# Patient Record
Sex: Female | Born: 2000 | Hispanic: No | Marital: Single | Smoking: Never smoker
Health system: Southern US, Community
[De-identification: ages and names within clinical notes are randomized; demographics above are authoritative.]

## PROBLEM LIST (undated history)

## (undated) DIAGNOSIS — E282 Polycystic ovarian syndrome: Secondary | ICD-10-CM

## (undated) DIAGNOSIS — E119 Type 2 diabetes mellitus without complications: Secondary | ICD-10-CM

## (undated) DIAGNOSIS — Z789 Other specified health status: Secondary | ICD-10-CM

## (undated) HISTORY — DX: Type 2 diabetes mellitus without complications: E11.9

## (undated) HISTORY — PX: OTHER SURGICAL HISTORY: SHX169

## (undated) HISTORY — DX: Other specified health status: Z78.9

## (undated) HISTORY — DX: Polycystic ovarian syndrome: E28.2

---
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
## 2020-12-18 ENCOUNTER — Ambulatory Visit (INDEPENDENT_AMBULATORY_CARE_PROVIDER_SITE_OTHER): Payer: Medicaid Other | Admitting: Certified Nurse Midwife

## 2020-12-18 ENCOUNTER — Encounter: Payer: Self-pay | Admitting: Certified Nurse Midwife

## 2020-12-18 ENCOUNTER — Other Ambulatory Visit: Payer: Self-pay

## 2020-12-18 VITALS — BP 88/52 | HR 94 | Ht 63.0 in | Wt 184.8 lb

## 2020-12-18 DIAGNOSIS — N921 Excessive and frequent menstruation with irregular cycle: Secondary | ICD-10-CM | POA: Insufficient documentation

## 2020-12-18 DIAGNOSIS — N939 Abnormal uterine and vaginal bleeding, unspecified: Secondary | ICD-10-CM

## 2020-12-18 LAB — POCT URINE PREGNANCY: Preg Test, Ur: NEGATIVE

## 2020-12-18 NOTE — Progress Notes (Signed)
GYN ENCOUNTER NOTE  Subjective:       Janice Haynes is a 20 y.o. G0P0000 female is here for gynecologic evaluation of the following issues:  1. Long irregular periods, desires to have regular cycles   Denies breathing difficulty, respiratory distress, chest pain, abdominal pain, and leg swelling or pain.  Gynecologic History  Patient's last menstrual period was 11/18/2020.  Period Duration (Days): 21 Period Pattern: (!) Irregular Menstrual Flow: Moderate Menstrual Control: Maxi pad,Panty liner Menstrual Control Change Freq (Hours): 4-5 Dysmenorrhea: (!) Moderate Dysmenorrhea Symptoms: Cramping,Diarrhea,Headache  Contraception: none  Last Pap: N/A  Obstetric History  OB History  Gravida Para Term Preterm AB Living  0 0 0 0 0 0  SAB IAB Ectopic Multiple Live Births  0 0 0 0 0    Past Medical History:  Diagnosis Date  . No pertinent past medical history     Past Surgical History:  Procedure Laterality Date  . no surgical history      No current outpatient medications on file prior to visit.   No current facility-administered medications on file prior to visit.    No Known Allergies  Social History   Socioeconomic History  . Marital status: Single    Spouse name: Not on file  . Number of children: Not on file  . Years of education: Not on file  . Highest education level: Not on file  Occupational History  . Not on file  Tobacco Use  . Smoking status: Never Smoker  . Smokeless tobacco: Never Used  Vaping Use  . Vaping Use: Never used  Substance and Sexual Activity  . Alcohol use: Never  . Drug use: Never  . Sexual activity: Yes    Birth control/protection: None  Other Topics Concern  . Not on file  Social History Narrative  . Not on file   Social Determinants of Health   Financial Resource Strain: Not on file  Food Insecurity: Not on file  Transportation Needs: Not on file  Physical Activity: Not on file  Stress: Not on file  Social  Connections: Not on file  Intimate Partner Violence: Not on file    Family History  Problem Relation Age of Onset  . Hypertension Mother   . Asthma Mother   . Asthma Father     The following portions of the patient's history were reviewed and updated as appropriate: allergies, current medications, past family history, past medical history, past social history, past surgical history and problem list.  Review of Systems  ROS- Negative other than what was reported. Information obtained verbally from patient.   Objective:   BP (!) 88/52   Pulse 94   Ht 5\' 3"  (1.6 m)   Wt 83.8 kg   LMP 11/18/2020   BMI 32.74 kg/m    CONSTITUTIONAL: Well-developed, well-nourished female in no acute distress.   PHYSICAL EXAM: Declined by patient.   Assessment:   1. Menorrhagia with irregular cycle  - POCT urine pregnancy  2. Abnormal Uterine Bleeding   Plan:   Discussed possible reasons for irregular menses and work up associated with each.   Reviewed all forms of birth control options available including abstinence; fertility period awareness methods; over the counter/barrier methods; hormonal contraceptive medication including pill, patch, ring, injection,contraceptive implant; hormonal and nonhormonal IUDs; permanent sterilization options including vasectomy and the various tubal sterilization modalities. Risks and benefits reviewed.  Questions were answered.  Information was given to patient to review.   Desires Nexplanon placement, wishes to  return during menses   Reviewed red flag symptoms and when to call.  RTC for labs and vaginal ultrasound, see orders.  RTC for Nexplanon placement when desired.    Juliann Pares, Student-MidWife [employer] 12/18/20 12:14 PM   I spent 20 minutes dedicated to the care of this patient on the date of this encounter to include pre-visit review of records, face to face time with patient, and post visit ordering of testing.

## 2020-12-18 NOTE — Progress Notes (Signed)
Pt present for irregular cycles. Pt stated that she spots off and on throughout the month and stated having one cycle for a month bleeding off and on.

## 2020-12-18 NOTE — Patient Instructions (Signed)
Etonogestrel implant What is this medicine? ETONOGESTREL (et oh noe JES trel) is a contraceptive (birth control) device. It is used to prevent pregnancy. It can be used for up to 3 years. This medicine may be used for other purposes; ask your health care provider or pharmacist if you have questions. COMMON BRAND NAME(S): Implanon, Nexplanon What should I tell my health care provider before I take this medicine? They need to know if you have any of these conditions:  abnormal vaginal bleeding  blood vessel disease or blood clots  breast, cervical, endometrial, ovarian, liver, or uterine cancer  diabetes  gallbladder disease  heart disease or recent heart attack  high blood pressure  high cholesterol or triglycerides  kidney disease  liver disease  migraine headaches  seizures  stroke  tobacco smoker  an unusual or allergic reaction to etonogestrel, anesthetics or antiseptics, other medicines, foods, dyes, or preservatives  pregnant or trying to get pregnant  breast-feeding How should I use this medicine? This device is inserted just under the skin on the inner side of your upper arm by a health care professional. Talk to your pediatrician regarding the use of this medicine in children. Special care may be needed. Overdosage: If you think you have taken too much of this medicine contact a poison control center or emergency room at once. NOTE: This medicine is only for you. Do not share this medicine with others. What if I miss a dose? This does not apply. What may interact with this medicine? Do not take this medicine with any of the following medications:  amprenavir  fosamprenavir This medicine may also interact with the following medications:  acitretin  aprepitant  armodafinil  bexarotene  bosentan  carbamazepine  certain medicines for fungal infections like fluconazole, ketoconazole, itraconazole and voriconazole  certain medicines to treat  hepatitis, HIV or AIDS  cyclosporine  felbamate  griseofulvin  lamotrigine  modafinil  oxcarbazepine  phenobarbital  phenytoin  primidone  rifabutin  rifampin  rifapentine  St. John's wort  topiramate This list may not describe all possible interactions. Give your health care provider a list of all the medicines, herbs, non-prescription drugs, or dietary supplements you use. Also tell them if you smoke, drink alcohol, or use illegal drugs. Some items may interact with your medicine. What should I watch for while using this medicine? This product does not protect you against HIV infection (AIDS) or other sexually transmitted diseases. You should be able to feel the implant by pressing your fingertips over the skin where it was inserted. Contact your doctor if you cannot feel the implant, and use a non-hormonal birth control method (such as condoms) until your doctor confirms that the implant is in place. Contact your doctor if you think that the implant may have broken or become bent while in your arm. You will receive a user card from your health care provider after the implant is inserted. The card is a record of the location of the implant in your upper arm and when it should be removed. Keep this card with your health records. What side effects may I notice from receiving this medicine? Side effects that you should report to your doctor or health care professional as soon as possible:  allergic reactions like skin rash, itching or hives, swelling of the face, lips, or tongue  breast lumps, breast tissue changes, or discharge  breathing problems  changes in emotions or moods  coughing up blood  if you feel that the implant   may have broken or bent while in your arm  high blood pressure  pain, irritation, swelling, or bruising at the insertion site  scar at site of insertion  signs of infection at the insertion site such as fever, and skin redness, pain or  discharge  signs and symptoms of a blood clot such as breathing problems; changes in vision; chest pain; severe, sudden headache; pain, swelling, warmth in the leg; trouble speaking; sudden numbness or weakness of the face, arm or leg  signs and symptoms of liver injury like dark yellow or brown urine; general ill feeling or flu-like symptoms; light-colored stools; loss of appetite; nausea; right upper belly pain; unusually weak or tired; yellowing of the eyes or skin  unusual vaginal bleeding, discharge Side effects that usually do not require medical attention (report to your doctor or health care professional if they continue or are bothersome):  acne  breast pain or tenderness  headache  irregular menstrual bleeding  nausea This list may not describe all possible side effects. Call your doctor for medical advice about side effects. You may report side effects to FDA at 1-800-FDA-1088. Where should I keep my medicine? This drug is given in a hospital or clinic and will not be stored at home. NOTE: This sheet is a summary. It may not cover all possible information. If you have questions about this medicine, talk to your doctor, pharmacist, or health care provider.  2021 Elsevier/Gold Standard (2019-08-17 11:33:04)   Abnormal Uterine Bleeding Abnormal uterine bleeding means bleeding more than usual from your womb (uterus). It can include:  Bleeding between menstrual periods.  Bleeding after sex.  Bleeding that is heavier than normal.  Menstrual periods that last longer than usual.  Bleeding after you have stopped having your menstrual period (menopause). There are many problems that may cause this. You should see a doctor for any kind of bleeding that is not normal. Treatment depends on the cause of the bleeding. Follow these instructions at home: Medicines  Take over-the-counter and prescription medicines only as told by your doctor.  Tell your doctor about other  medicines that you take. ? If told by your doctor, stop taking aspirin or medicines that have aspirin in them. These medicines can make you bleed more.  You may be given iron pills to replace iron that your body loses because of this condition. Take them as told by your doctor. Managing constipation If you are taking iron pills, you may have trouble pooping (constipation). To prevent or treat trouble pooping, you may need to:  Drink enough fluid to keep your pee (urine) pale yellow.  Take over-the-counter or prescription medicines.  Eat foods that are high in fiber. These include beans, whole grains, and fresh fruits and vegetables.  Limit foods that are high in fat and sugar. These include fried or sweet foods. General instructions  Watch your condition for any changes.  Do not use tampons, douche, or have sex, if your doctor tells you not to.  Change your pads often.  Get regular exams. This includes pelvic exams and cervical cancer screenings. ? It is up to you to get the results of any tests that are done. Ask your doctor, or the department that is doing the tests, when your results will be ready.  Keep all follow-up visits as told by your doctor. This is important. Contact a doctor if:  The bleeding lasts more than 1 week.  You feel dizzy at times.  You feel like you may  vomit (nausea).  You vomit.  You feel light-headed or weak.  Your symptoms get worse. Get help right away if:  You pass out.  You have to change pads every hour.  You have pain in your belly.  You have a fever or chills.  You get sweaty.  You get weak.  You pass large blood clots from your vagina. Summary  Abnormal uterine bleeding means bleeding more than usual from your womb (uterus).  Any kind of bleeding that is not normal should be checked by a doctor.  Treatment depends on the cause of the bleeding.  Get help right away if you pass out, you have to change pads every hour, or  you pass large blood clots from your vagina. This information is not intended to replace advice given to you by your health care provider. Make sure you discuss any questions you have with your health care provider. Document Revised: 09/07/2019 Document Reviewed: 09/07/2019 Elsevier Patient Education  2021 [employer].

## 2020-12-18 NOTE — Progress Notes (Signed)
I have seen, interviewed, and examined the patient in conjunction with the [employer] and affirm the diagnosis and management plan.   Gunnar Bulla, CNM [facility], [facility] 12/18/20 2:06 PM

## 2021-01-01 ENCOUNTER — Telehealth: Payer: Self-pay

## 2021-01-01 NOTE — Telephone Encounter (Signed)
Patient is NOT OB patient. May have labs collected at any time. Thanks, JML

## 2021-01-01 NOTE — Telephone Encounter (Addendum)
Called patient to reschedule lab apt on 2-17, LMTCB. Patient is scheduled on 2-17 for [place] and GYN visit- is it appropriate to have pt come in for labs prior to ob visit or can patient have labs done at another [facility] location after appointment?

## 2021-01-04 ENCOUNTER — Other Ambulatory Visit: Payer: Medicaid Other

## 2021-01-04 ENCOUNTER — Encounter: Payer: Medicaid Other | Admitting: Certified Nurse Midwife

## 2021-02-12 ENCOUNTER — Other Ambulatory Visit: Payer: Self-pay | Admitting: Certified Nurse Midwife

## 2021-02-12 DIAGNOSIS — N921 Excessive and frequent menstruation with irregular cycle: Secondary | ICD-10-CM

## 2021-02-12 DIAGNOSIS — N939 Abnormal uterine and vaginal bleeding, unspecified: Secondary | ICD-10-CM

## 2021-02-21 ENCOUNTER — Ambulatory Visit: Admission: RE | Admit: 2021-02-21 | Payer: Medicaid Other | Source: Ambulatory Visit

## 2021-05-22 ENCOUNTER — Ambulatory Visit: Payer: Medicaid Other | Admitting: Cardiology

## 2021-05-23 ENCOUNTER — Encounter: Payer: Self-pay | Admitting: Cardiology

## 2021-11-05 ENCOUNTER — Encounter: Payer: Self-pay | Admitting: Emergency Medicine

## 2021-11-05 DIAGNOSIS — N939 Abnormal uterine and vaginal bleeding, unspecified: Secondary | ICD-10-CM | POA: Insufficient documentation

## 2021-11-05 DIAGNOSIS — R102 Pelvic and perineal pain: Secondary | ICD-10-CM | POA: Diagnosis present

## 2021-11-05 DIAGNOSIS — D649 Anemia, unspecified: Secondary | ICD-10-CM | POA: Insufficient documentation

## 2021-11-05 LAB — CBC
HCT: 29.4 % — ABNORMAL LOW (ref 36.0–46.0)
Hemoglobin: 8.5 g/dL — ABNORMAL LOW (ref 12.0–15.0)
MCH: 19.4 pg — ABNORMAL LOW (ref 26.0–34.0)
MCHC: 28.9 g/dL — ABNORMAL LOW (ref 30.0–36.0)
MCV: 67.1 fL — ABNORMAL LOW (ref 80.0–100.0)
Platelets: 401 10*3/uL — ABNORMAL HIGH (ref 150–400)
RBC: 4.38 MIL/uL (ref 3.87–5.11)
RDW: 17.9 % — ABNORMAL HIGH (ref 11.5–15.5)
WBC: 11.9 10*3/uL — ABNORMAL HIGH (ref 4.0–10.5)
nRBC: 0 % (ref 0.0–0.2)

## 2021-11-05 LAB — HCG, QUANTITATIVE, PREGNANCY: hCG, Beta Chain, Quant, S: <1 m[IU]/mL (ref <5)

## 2021-11-05 NOTE — ED Triage Notes (Signed)
Pt reports she has had continued vaginal bleeding since July and seen OBGYN, started on new birth control but still continuing to bleed. Pt to ED tonight for concern of need for blood because pt anemic. Pt sts she has had intermittent symptoms since July with lightheadedness as well as pale skin tone, increased malaise. Last had blood work 1 month ago for symptoms, no blood transfusion needed at that time per pt.

## 2021-11-06 ENCOUNTER — Emergency Department: Payer: Medicaid Other

## 2021-11-06 ENCOUNTER — Emergency Department
Admission: EM | Admit: 2021-11-06 | Discharge: 2021-11-06 | Disposition: A | Discharge status: Home / Self Care | Payer: Medicaid Other | Attending: Emergency Medicine | Admitting: Emergency Medicine

## 2021-11-06 DIAGNOSIS — D649 Anemia, unspecified: Secondary | ICD-10-CM

## 2021-11-06 DIAGNOSIS — N939 Abnormal uterine and vaginal bleeding, unspecified: Secondary | ICD-10-CM

## 2021-11-06 DIAGNOSIS — R52 Pain, unspecified: Secondary | ICD-10-CM

## 2021-11-06 LAB — COMPREHENSIVE METABOLIC PANEL
ALT: 13 U/L (ref 0–44)
AST: 18 U/L (ref 15–41)
Albumin: 3.6 g/dL (ref 3.5–5.0)
Alkaline Phosphatase: 55 U/L (ref 38–126)
Anion gap: 7 (ref 5–15)
BUN: 13 mg/dL (ref 6–20)
CO2: 24 mmol/L (ref 22–32)
Calcium: 8.6 mg/dL — ABNORMAL LOW (ref 8.9–10.3)
Chloride: 105 mmol/L (ref 98–111)
Creatinine, Ser: 0.66 mg/dL (ref 0.44–1.00)
GFR, Estimated: >60 mL/min (ref >60)
Glucose, Bld: 104 mg/dL — ABNORMAL HIGH (ref 70–99)
Potassium: 3.9 mmol/L (ref 3.5–5.1)
Sodium: 136 mmol/L (ref 135–145)
Total Bilirubin: 0.2 mg/dL — ABNORMAL LOW (ref 0.3–1.2)
Total Protein: 7.9 g/dL (ref 6.5–8.1)

## 2021-11-06 MED ORDER — MEDROXYPROGESTERONE ACETATE 10 MG PO TABS: 10.0000 mg | ORAL_TABLET | Freq: Every day | ORAL | 0 refills | Status: DC

## 2021-11-06 NOTE — Discharge Instructions (Addendum)
1.  I strongly encourage you to take iron tablets daily. 2.  Take Provera 10mg  daily x10 days. 3.  Return to the ER for worsening symptoms, persistent vomiting, difficulty breathing or other concerns.

## 2021-11-06 NOTE — ED Provider Notes (Signed)
[facility] Emergency Department Provider Note   ____________________________________________   Event Date/Time   First MD Initiated Contact with Patient 11/06/21 0532     (approximate)  I have reviewed the triage vital signs and the nursing notes.   HISTORY  Chief Complaint Vaginal Bleeding    HPI Janice Haynes is a 20 y.o. female who presents to the ED from home with a chief complaint of vaginal bleeding.  Patient has had continued vaginal bleeding since July.  Also recently diagnosed with PCOS.  She is followed by Dr. Leafy Ro from GYN who started her on contraceptive patch 5 weeks ago.  She has been instructed to take iron tablets but admits she does not because she does not like to take medicines.  Patient concern for heavy vaginal bleeding and concern for anemia requiring blood transfusion.  Experiences occasional lightheadedness.  Denies fever, cough, chest pain, shortness of breath, nausea, vomiting.     Past Medical History:  Diagnosis Date   No pertinent past medical history     Patient Active Problem List   Diagnosis Date Noted   Menorrhagia with irregular cycle 12/18/2020    Past Surgical History:  Procedure Laterality Date   no surgical history      Prior to Admission medications   Medication Sig Start Date End Date Taking? Authorizing Provider  medroxyPROGESTERone (PROVERA) 10 MG tablet Take 1 tablet (10 mg total) by mouth daily. 11/06/21  Yes Paulette Blanch, MD    Allergies Patient has no known allergies.  Family History  Problem Relation Age of Onset   Hypertension Mother    Asthma Mother    Asthma Father     Social History Social History   Tobacco Use   Smoking status: Never   Smokeless tobacco: Never  Vaping Use   Vaping Use: Never used  Substance Use Topics   Alcohol use: Never   Drug use: Never    Review of Systems  Constitutional: No fever/chills Eyes: No visual changes. ENT: No sore  throat. Cardiovascular: Denies chest pain. Respiratory: Denies shortness of breath. Gastrointestinal: No abdominal pain.  No nausea, no vomiting.  No diarrhea.  No constipation. Genitourinary: Positive for heavy vaginal bleeding. Negative for dysuria. Musculoskeletal: Negative for back pain. Skin: Negative for rash. Neurological: Negative for headaches, focal weakness or numbness.   ____________________________________________   PHYSICAL EXAM:  VITAL SIGNS: ED Triage Vitals [11/05/21 2251]  Enc Vitals Group     BP (!) 169/94     Pulse Rate (!) 112     Resp 20     Temp 98.7 F (37.1 C)     Temp Source Oral     SpO2 100 %     Weight 186 lb (84.4 kg)     Height 5\' 3"  (1.6 m)     Head Circumference      Peak Flow      Pain Score      Pain Loc      Pain Edu?      Excl. in [place]?     Constitutional: Alert and oriented. Well appearing and in no acute distress. Eyes: Conjunctivae are normal. PERRL. EOMI. Head: Atraumatic. Nose: No congestion/rhinnorhea. Mouth/Throat: Mucous membranes are moist.   Neck: No stridor.   Cardiovascular: Normal rate, regular rhythm. Grossly normal heart sounds.  Good peripheral circulation. Respiratory: Normal respiratory effort.  No retractions. Lungs CTAB. Gastrointestinal: Soft and nontender to light or deep palpation. No distention. No abdominal bruits. No CVA  tenderness. Musculoskeletal: No lower extremity tenderness nor edema.  No joint effusions. Neurologic:  Normal speech and language. No gross focal neurologic deficits are appreciated. No gait instability. Skin:  Skin is warm, dry and intact. No rash noted. Psychiatric: Mood and affect are normal. Speech and behavior are normal.  ____________________________________________   LABS (all labs ordered are listed, but only abnormal results are displayed)  Labs Reviewed  COMPREHENSIVE METABOLIC PANEL - Abnormal; Notable for the following components:      Result Value   Glucose, Bld 104 (*)     Calcium 8.6 (*)    Total Bilirubin 0.2 (*)    All other components within normal limits  CBC - Abnormal; Notable for the following components:   WBC 11.9 (*)    Hemoglobin 8.5 (*)    HCT 29.4 (*)    MCV 67.1 (*)    MCH 19.4 (*)    MCHC 28.9 (*)    RDW 17.9 (*)    Platelets 401 (*)    All other components within normal limits  HCG, QUANTITATIVE, PREGNANCY   ____________________________________________  EKG  None ____________________________________________  RADIOLOGY I, Shuntel Fishburn J, personally viewed and evaluated these images (plain radiographs) as part of my medical decision making, as well as reviewing the written report by the radiologist.  ED MD interpretation:  Unremarkable uterus and endometrium; markers for PCOS  Official radiology report(s): US PELVIC COMPLETE W TRANSVAGINAL AND TORSION R/O  Result Date: 11/06/2021 CLINICAL DATA:  Pelvic pain. EXAM: TRANSABDOMINAL AND TRANSVAGINAL ULTRASOUND OF PELVIS DOPPLER ULTRASOUND OF OVARIES TECHNIQUE: Both transabdominal and transvaginal ultrasound examinations of the pelvis were performed. Transabdominal technique was performed for global imaging of the pelvis including uterus, ovaries, adnexal regions, and pelvic cul-de-sac. It was necessary to proceed with endovaginal exam following the transabdominal exam to visualize the endometrium and ovaries. Color and duplex Doppler ultrasound was utilized to evaluate blood flow to the ovaries. COMPARISON:  None. FINDINGS: Uterus Measurements: 6.0 x 3.4 x 4.0 cm = volume: 43 mL. No fibroids or other mass visualized. Endometrium Thickness: 2 mm.  No focal abnormality visualized. Right ovary Measurements: 5.0 x 1.8 x 2.1 cm = volume: 10.3 mL. Mildly enlarged with multiple peripherally located uniform sized follicles. Left ovary Measurements: 3.6 x 2.8 x 2.5 cm = volume: 13.1 mL. Mildly enlarged with multiple peripherally located uniform sized follicles. Pulsed Doppler evaluation of both  ovaries demonstrates normal low-resistance arterial and venous waveforms. Other findings No abnormal free fluid. IMPRESSION: 1. Unremarkable uterus and endometrium. 2. Polycystic ovarian morphology. Correlation with clinical exam and biochemical markers recommended to evaluate for possibility of polycystic ovarian syndrome. Electronically Signed   By: Elgie Collard M.D.   On: 11/06/2021 01:45    ____________________________________________   PROCEDURES  Procedure(s) performed (including Critical Care):  Procedures  Pelvic exam: External exam within normal limits without rashes, lesions or vesicles.  Speculum exam reveals mild vaginal bleeding.  Cervical os is closed.  Unremarkable bimanual exam  ____________________________________________   INITIAL IMPRESSION / ASSESSMENT AND PLAN / ED COURSE  As part of my medical decision making, I reviewed the following data within the electronic MEDICAL RECORD NUMBER Nursing notes reviewed and incorporated, Labs reviewed, Old chart reviewed (10/04/2021 GYN office visit), and Notes from prior ED visits     20 year old female here with continued vaginal bleeding since July. Differential diagnosis includes, but is not limited to, ovarian cyst, ovarian torsion, acute appendicitis, diverticulitis, urinary tract infection/pyelonephritis, endometriosis, bowel obstruction, colitis, renal colic, gastroenteritis,  hernia, fibroids, endometriosis, pregnancy related pain including ectopic pregnancy, etc.   Laboratory results demonstrate mild anemia not requiring blood transfusion.  Patient shows me her prior lab results from November which were H/H 9.4/33.  Updated her on laboratory and ultrasound results.  We discussed placing her on a 10-day course of Provera.  I did notify her that this does increase her risk for DVT given that she is on the contraceptive patch as well as a temporary course of Provera.  She is a non-smoker.  Patient does want to try Provera.  I  strongly encouraged her to resume iron tablets.  She will follow-up closely with GYN.  Strict return precautions given.  Patient verbalizes understanding agrees with plan of care.      ____________________________________________   FINAL CLINICAL IMPRESSION(S) / ED DIAGNOSES  Final diagnoses:  Pain  Abnormal uterine bleeding (AUB)  Anemia, unspecified type     ED Discharge Orders          Ordered    medroxyPROGESTERone (PROVERA) 10 MG tablet  Daily        11/06/21 0604             Note:  This document was prepared using Dragon voice recognition software and may include unintentional dictation errors.    Paulette Blanch, MD 11/06/21 [phone]

## 2021-11-20 DIAGNOSIS — E119 Type 2 diabetes mellitus without complications: Secondary | ICD-10-CM | POA: Insufficient documentation

## 2022-01-09 ENCOUNTER — Encounter: Payer: Self-pay | Admitting: Dietician

## 2022-01-09 ENCOUNTER — Other Ambulatory Visit: Payer: Self-pay

## 2022-01-09 ENCOUNTER — Encounter: Payer: Medicaid Other | Admitting: Dietician

## 2022-01-09 VITALS — Ht 62.0 in | Wt 185.4 lb

## 2022-01-09 DIAGNOSIS — R7309 Other abnormal glucose: Secondary | ICD-10-CM | POA: Diagnosis present

## 2022-01-09 DIAGNOSIS — Z713 Dietary counseling and surveillance: Secondary | ICD-10-CM | POA: Diagnosis not present

## 2022-01-09 NOTE — Patient Instructions (Signed)
Plan to have a meal or snack, or nutritious drink every 3-5 hours during the day. Keep working to drink plenty of water and other sugar free beverages during the day.  Work on ways to get better sleep. Inadequate sleep keeps blood sugar up and makes weight loss more difficult. Start back with some exercise, at least 15-20 minutes, as you are able to.

## 2022-01-09 NOTE — Progress Notes (Signed)
Medical Nutrition Therapy: Visit start time: 1100  end time: 1210  Assessment:  Diagnosis: abnormal blood glucose Past medical history: PCOS, heavy menstrual bleeding, iron deficiency Psychosocial issues/ stress concerns: none  Preferred learning method:  Visual Hands-on   Current weight: 185.4lbs Height: 5'2" BMI: 33.91  Medications, supplements: reconciled list in medical record  Progress and evaluation:  Recent HbA1C of 6.5% (09/20/21), patient will see MD next month and have repeat testing. She does report some frequent urination, thirst (but also unable to drink much during the day due to busy work schedule) She has been working on eating smaller portions and eating more slowly. She reports weight gain over past 4 years since eating meals with boyfriend's family, whose grandmother cooks fried foods and high fat foods often. Reports some recent decrease in appetite.  Works long hours 5-6 days a week; commutes 1 hour to work and back with sister. Patient reports some rapid heart rate, and running out of breath easily. Some family members have asthma and she wonders if she also has asthma. Patient reports lactose intolerance.  Physical activity: no regular exercise due to work schedule, fatigue  Dietary Intake:  Usual eating pattern includes 1-2 meals and 0-1 snacks per day. Dining out frequency: 0-1 meals per week.  Breakfast: usually skips, no time. Does have some cereal bars Snack: 0 Lunch: often skips due to work. Occasionally chips or other snack. Rice available at work, with seafood;  Snack: 0 Supper: mom cooks meals usually saturdays, patient cooks some meals; soups with veg ie butternut squash, rice; chicken with yellow rice; ground beef with gravy, onions, rice; baked spaghetti; alredo pasta; steak; chicken pie Snack: 0 Beverages: water, occasional soda ie Mt. Dew (causes heartburn); some koolaid or capri sun  Nutrition Care Education: Topics covered:  Basic nutrition:  basic food groups, appropriate nutrient balance, appropriate meal and snack schedule, general nutrition guidelines    Weight control: importance of low sugar and low fat choices, appropriate food portions, estimated energy needs for weight loss at 1300-1400kcal, provided guidance for 45% CHO, 25% pro, 30% fat Advanced nutrition:  cooking techniques to accommodate busy schedule ie batch cooking, meal prep Diabetes: appropriate meal and snack schedule, appropriate carb intake and balance, healthy carb choices, role of fiber, protein, fat   Nutritional Diagnosis:  [place]-2.2 Altered nutrition-related laboratory As related to pre-diabetes.  As evidenced by elevated HbA1C. [place]-3.3 Overweight/obesity As related to PCOS, history of excess calories, frequent skipped meals.  As evidenced by patient with current BMI of 33.9, making dietary and lifestyle changes to promote weight loss and improve health.  Intervention:  Instruction and discussion as noted above. Commended patient for changes made thus far.  Established additional goals for change with direction from patient.  Education Materials given:  [employer] guidelines for Diabetes Plate Planner with food lists, sample meal pattern Sample menus Carb-mindful smoothie recipe template Snacking handout Visit summary with goals/ instructions   Learner/ who was taught:  Patient    Level of understanding: Verbalizes/ demonstrates competency   Demonstrated degree of understanding via:   Teach back Learning barriers: None  Willingness to learn/ readiness for change: Eager, change in progress   Monitoring and Evaluation:  Dietary intake, exercise, BG control, nutritional status, and body weight      follow up:  02/20/22 at 11:00am

## 2022-02-27 ENCOUNTER — Ambulatory Visit: Payer: Medicaid Other | Admitting: Dietician

## 2022-03-14 ENCOUNTER — Encounter: Payer: Self-pay | Admitting: Dietician

## 2022-03-14 NOTE — Progress Notes (Signed)
Patient did not keep her follow up MNT appointment on 02/27/22. We have not heard from her to reschedule; sent notification to referring provider. ?

## 2022-06-24 ENCOUNTER — Other Ambulatory Visit: Payer: Self-pay

## 2022-06-24 ENCOUNTER — Emergency Department
Admission: EM | Admit: 2022-06-24 | Discharge: 2022-06-24 | Disposition: A | Discharge status: Home / Self Care | Payer: Medicaid Other | Attending: Emergency Medicine | Admitting: Emergency Medicine

## 2022-06-24 ENCOUNTER — Emergency Department: Payer: Medicaid Other

## 2022-06-24 ENCOUNTER — Encounter: Payer: Self-pay | Admitting: *Deleted

## 2022-06-24 DIAGNOSIS — Y92410 Unspecified street and highway as the place of occurrence of the external cause: Secondary | ICD-10-CM | POA: Insufficient documentation

## 2022-06-24 DIAGNOSIS — M545 Low back pain, unspecified: Secondary | ICD-10-CM | POA: Diagnosis present

## 2022-06-24 LAB — URINALYSIS, ROUTINE W REFLEX MICROSCOPIC
Bilirubin Urine: NEGATIVE
Glucose, UA: NEGATIVE mg/dL
Hgb urine dipstick: NEGATIVE
Ketones, ur: NEGATIVE mg/dL
Leukocytes,Ua: NEGATIVE
Nitrite: NEGATIVE
Protein, ur: NEGATIVE mg/dL
Specific Gravity, Urine: 1.027 (ref 1.005–1.030)
pH: 6 (ref 5.0–8.0)

## 2022-06-24 LAB — POC URINE PREG, ED: Preg Test, Ur: NEGATIVE

## 2022-06-24 MED ORDER — LIDOCAINE 5 % EX PTCH: 1.0000 | MEDICATED_PATCH | Freq: Two times a day (BID) | CUTANEOUS | 0 refills | Status: AC

## 2022-06-24 MED ORDER — ACETAMINOPHEN 325 MG PO TABS
650.0000 mg | ORAL_TABLET | Freq: Once | ORAL | Status: AC
Start: 2022-06-24 — End: 2022-06-24
  Administered 2022-06-24: 650 mg via ORAL
  Filled 2022-06-24: qty 2

## 2022-06-24 MED ORDER — LIDOCAINE 5 % EX PTCH
1.0000 | MEDICATED_PATCH | CUTANEOUS | Status: DC
  Administered 2022-06-24: 1 via TRANSDERMAL
  Filled 2022-06-24: qty 1

## 2022-06-24 MED ORDER — IBUPROFEN 600 MG PO TABS
600.0000 mg | ORAL_TABLET | Freq: Once | ORAL | Status: AC
Start: 2022-06-24 — End: 2022-06-24
  Administered 2022-06-24: 600 mg via ORAL
  Filled 2022-06-24: qty 1

## 2022-06-24 NOTE — Discharge Instructions (Addendum)
Your x-ray was normal.  You may use the patches for pain.  May also take Tylenol/ibuprofen per package instructions to help with any achiness.  Please return to the emergency department for any new, worsening, or changing symptoms or other concerns including weakness in your legs, urinary or stool incontinence or retention, numbness or tingling in your extremities/buttocks/groin, vision changes, vomiting, blood in your urine, chest pain, trouble breathing, or any other concerns or change in symptoms.
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
## 2023-02-25 DIAGNOSIS — N939 Abnormal uterine and vaginal bleeding, unspecified: Secondary | ICD-10-CM | POA: Insufficient documentation

## 2023-02-25 LAB — COMPREHENSIVE METABOLIC PANEL
ALT: 26 U/L (ref 0–44)
AST: 23 U/L (ref 15–41)
Albumin: 3.9 g/dL (ref 3.5–5.0)
Alkaline Phosphatase: 60 U/L (ref 38–126)
Anion gap: 9 (ref 5–15)
BUN: 11 mg/dL (ref 6–20)
CO2: 24 mmol/L (ref 22–32)
Calcium: 8.8 mg/dL — ABNORMAL LOW (ref 8.9–10.3)
Chloride: 102 mmol/L (ref 98–111)
Creatinine, Ser: 0.73 mg/dL (ref 0.44–1.00)
GFR, Estimated: >60 mL/min (ref >60)
Glucose, Bld: 185 mg/dL — ABNORMAL HIGH (ref 70–99)
Potassium: 3.6 mmol/L (ref 3.5–5.1)
Sodium: 135 mmol/L (ref 135–145)
Total Bilirubin: 0.4 mg/dL (ref 0.3–1.2)
Total Protein: 7.6 g/dL (ref 6.5–8.1)

## 2023-02-25 LAB — CBC WITH DIFFERENTIAL/PLATELET
Abs Immature Granulocytes: 0.04 10*3/uL (ref 0.00–0.07)
Basophils Absolute: 0.1 10*3/uL (ref 0.0–0.1)
Basophils Relative: 1 %
Eosinophils Absolute: 0.2 10*3/uL (ref 0.0–0.5)
Eosinophils Relative: 2 %
HCT: 36.6 % (ref 36.0–46.0)
Hemoglobin: 11.2 g/dL — ABNORMAL LOW (ref 12.0–15.0)
Immature Granulocytes: 0 %
Lymphocytes Relative: 48 %
Lymphs Abs: 5.1 10*3/uL — ABNORMAL HIGH (ref 0.7–4.0)
MCH: 25.3 pg — ABNORMAL LOW (ref 26.0–34.0)
MCHC: 30.6 g/dL (ref 30.0–36.0)
MCV: 82.8 fL (ref 80.0–100.0)
Monocytes Absolute: 0.4 10*3/uL (ref 0.1–1.0)
Monocytes Relative: 4 %
Neutro Abs: 4.9 10*3/uL (ref 1.7–7.7)
Neutrophils Relative %: 45 %
Platelets: 373 10*3/uL (ref 150–400)
RBC: 4.42 MIL/uL (ref 3.87–5.11)
RDW: 14.5 % (ref 11.5–15.5)
WBC: 10.7 10*3/uL — ABNORMAL HIGH (ref 4.0–10.5)
nRBC: 0 % (ref 0.0–0.2)

## 2023-02-25 LAB — POC URINE PREG, ED: Preg Test, Ur: NEGATIVE

## 2023-02-25 LAB — URINALYSIS, ROUTINE W REFLEX MICROSCOPIC
Bacteria, UA: NONE SEEN
Bilirubin Urine: NEGATIVE
Glucose, UA: NEGATIVE mg/dL
Ketones, ur: NEGATIVE mg/dL
Leukocytes,Ua: NEGATIVE
Nitrite: NEGATIVE
Protein, ur: NEGATIVE mg/dL
Specific Gravity, Urine: 1.02 (ref 1.005–1.030)
pH: 6 (ref 5.0–8.0)

## 2023-02-25 NOTE — ED Triage Notes (Signed)
Pt presents ambulatory to triage via POV with complaints of vaginal bleeding for the last 2 months. Pt notes starting her period two months ago and has had persistent bleeding since then. Pt endorses vaginal cramping as well. Hx of PCOS - not on birth control. A&Ox4 at this time. Denies dizziness, N/V/D, CP or SOB.

## 2023-02-26 ENCOUNTER — Emergency Department
Admission: EM | Admit: 2023-02-26 | Discharge: 2023-02-26 | Disposition: A | Discharge status: Home / Self Care | Payer: Medicaid Other | Attending: Emergency Medicine | Admitting: Emergency Medicine

## 2023-02-26 DIAGNOSIS — N939 Abnormal uterine and vaginal bleeding, unspecified: Secondary | ICD-10-CM

## 2023-02-26 NOTE — ED Provider Notes (Signed)
[facility] Provider Note    Event Date/Time   First MD Initiated Contact with Patient 02/26/23 0201     (approximate)   History   Vaginal Bleeding   HPI  Janice Haynes is a 22 y.o. female who presents to the ED for evaluation of Vaginal Bleeding   I reviewed [facility] visit from 09/2021.  History of menorrhagia.  G0.  Patient presents to the ED alongside her boyfriend for evaluation of subacute vaginal bleeding.  She reports trying multiple hormonal therapies in the past with minimal improvement.  She is not currently on any sort of therapies or birth control as she and her boyfriend were trying to get pregnant.  She reports consistent flow over the past 1-2 months.  She reports she sometimes feels weak but never has syncope.  No fevers or other discharge beyond the bleeding.  No other new bleeding symptoms elsewhere.  She has not reached out to her OB/GYN.  She reports cramping with the bleeding but no new pain.   Physical Exam   Triage Vital Signs: ED Triage Vitals [02/25/23 2256]  Enc Vitals Group     BP (!) 143/94     Pulse Rate (!) 113     Resp 18     Temp 98.2 F (36.8 C)     Temp Source Oral     SpO2 100 %     Weight 194 lb (88 kg)     Height 5\' 2"  (1.575 m)     Head Circumference      Peak Flow      Pain Score 3     Pain Loc      Pain Edu?      Excl. in GC?     Most recent vital signs: Vitals:   02/25/23 2256 02/26/23 0235  BP: (!) 143/94 121/76  Pulse: (!) 113 79  Resp: 18 18  Temp: 98.2 F (36.8 C) 97.9 F (36.6 C)  SpO2: 100% 100%    General: Awake, no distress.  CV:  Good peripheral perfusion.  Resp:  Normal effort.  Abd:  No distention.  Soft and benign throughout MSK:  No deformity noted.  Neuro:  No focal deficits appreciated. Other:     ED Results / Procedures / Treatments   Labs (all labs ordered are listed, but only abnormal results are displayed) Labs Reviewed  CBC WITH DIFFERENTIAL/PLATELET  - Abnormal; Notable for the following components:      Result Value   WBC 10.7 (*)    Hemoglobin 11.2 (*)    MCH 25.3 (*)    Lymphs Abs 5.1 (*)    All other components within normal limits  COMPREHENSIVE METABOLIC PANEL - Abnormal; Notable for the following components:   Glucose, Bld 185 (*)    Calcium 8.8 (*)    All other components within normal limits  URINALYSIS, ROUTINE W REFLEX MICROSCOPIC - Abnormal; Notable for the following components:   Color, Urine YELLOW (*)    APPearance CLEAR (*)    Hgb urine dipstick MODERATE (*)    All other components within normal limits  POC URINE PREG, ED    EKG   RADIOLOGY   Official radiology report(s): No results found.  PROCEDURES and INTERVENTIONS:  Procedures  Medications - No data to display   IMPRESSION / MDM / ASSESSMENT AND PLAN / ED COURSE  I reviewed the triage vital signs and the nursing notes.  Differential diagnosis includes, but is  not limited to, uterine fibroids, pregnancy, miscarriage, PID  {Patient presents with symptoms of an acute illness or injury that is potentially life-threatening.  Well-appearing young woman presents with subacute recurrence of chronic vaginal bleeding in setting of known PCOS and menorrhagia.  Her hemoglobin is improved from previous reading 1 year ago and no indications for transfusions.  No signs of symptomatic anemia.  Normal metabolic panel and urine without infectious features.  She is not pregnant.  Benign abdominal exam.  No indications for emergent pelvic ultrasound.  We discussed the consideration of NSAIDs as she does not want to try any different hormonal therapy.  We discussed the importance of following up with her OB/GYN.  We discussed return precautions     FINAL CLINICAL IMPRESSION(S) / ED DIAGNOSES   Final diagnoses:  Vaginal bleeding  Abnormal uterine bleeding (AUB)     Rx / DC Orders   ED Discharge Orders     None        Note:  This document was prepared  using Dragon voice recognition software and may include unintentional dictation errors.   Delton [place], MD 02/26/23 [phone]

## 2023-02-26 NOTE — Discharge Instructions (Signed)
Ibuprofen, take 400-600 mg, up to 3 times per day.  CALL YOUR OBGYN  Return to the ED if you pass out, have fevers or severely worsening abdominal pain

## 2024-01-20 DIAGNOSIS — N921 Excessive and frequent menstruation with irregular cycle: Secondary | ICD-10-CM | POA: Diagnosis not present

## 2024-01-20 DIAGNOSIS — Z8742 Personal history of other diseases of the female genital tract: Secondary | ICD-10-CM | POA: Diagnosis not present

## 2024-01-27 ENCOUNTER — Other Ambulatory Visit (HOSPITAL_COMMUNITY)
Admission: RE | Admit: 2024-01-27 | Discharge: 2024-01-27 | Disposition: A | Discharge status: Home / Self Care | Source: Ambulatory Visit

## 2024-01-27 ENCOUNTER — Ambulatory Visit (INDEPENDENT_AMBULATORY_CARE_PROVIDER_SITE_OTHER)

## 2024-01-27 VITALS — BP 114/78 | HR 108 | Ht 62.0 in | Wt 201.8 lb

## 2024-01-27 DIAGNOSIS — Z113 Encounter for screening for infections with a predominantly sexual mode of transmission: Secondary | ICD-10-CM

## 2024-01-27 DIAGNOSIS — N921 Excessive and frequent menstruation with irregular cycle: Secondary | ICD-10-CM | POA: Diagnosis not present

## 2024-01-27 NOTE — Progress Notes (Signed)
   GYN ENCOUNTER  Encounter for irregular menses.  Subjective  HPI: Janice Haynes is a 23 y.o. G0P0000 who presents today for management of irregular cycles.  Was previously on Depo Provera, COCs, and the patch but did not like any of them.   Has been trying for a year to get pregnant without succes  Last period started the end of January and she bled every day until yesterday. She has had issues with extended periods of bleeding for several years.   Desires medication for weight loss.   Past Medical History:  Diagnosis Date   No pertinent past medical history    Past Surgical History:  Procedure Laterality Date   no surgical history     OB History     Gravida  0   Para  0   Term  0   Preterm  0   AB  0   Living  0      SAB  0   IAB  0   Ectopic  0   Multiple  0   Live Births  0          Allergies  Allergen Reactions   Gramineae Pollens Itching    Review of Systems  12 point ROS negative except for pertinent positives noted in HPO above.   Objective  BP 114/78   Pulse (!) 108   Ht 5\' 2"  (1.575 m)   Wt 201 lb 12.8 oz (91.5 kg)   LMP 12/18/2023 (Exact Date)   BMI 36.91 kg/m   Physical examination GENERAL APPEARANCE: alert, well appearing LUNGS: normal work of breathing HEART: normal heart rate Pelvic exam: normal external genitalia, vulva, vagina, cervix, uterus and adnexa, VULVA: normal appearing vulva with no masses, tenderness or lesions, VAGINA: normal appearing vagina with normal color and discharge, no lesions, small amount of dark blood in vaginal vault CERVIX: normal appearing cervix without discharge or lesions.   Assessment/Plan - Pap smear and STI swab collected today as she has not had testing prior.  - TSH, CBC, Hgb A1c, testosterone labs collected for assessment of PCOS. We discussed the recommendations for management of irregular menses would involve hormonal regulation which she does not desire at this time. We also  discussed that lifestyle management with weight loss and diet may help regulate her periods.  - Recommended follow up with PCP or provider who manages weight loss medications for questions about starting Ozempic. Encouraged lifestyle management with diet and exercise.  - Referral to reproductive endocrinology due to desire for pregnancy and inability to get pregnant in last year.   Lindalou Hose Virgene Tirone, CNM  01/27/24 2:12 PM

## 2024-01-29 LAB — CYTOLOGY - PAP
Chlamydia: NEGATIVE
Comment: NEGATIVE
Comment: NEGATIVE
Comment: NORMAL
Diagnosis: NEGATIVE
Neisseria Gonorrhea: NEGATIVE
Trichomonas: NEGATIVE

## 2024-02-02 ENCOUNTER — Telehealth: Payer: Self-pay

## 2024-02-02 DIAGNOSIS — N921 Excessive and frequent menstruation with irregular cycle: Secondary | ICD-10-CM

## 2024-02-02 LAB — CBC
Hematocrit: 39 % (ref 34.0–46.6)
Hemoglobin: 12.3 g/dL (ref 11.1–15.9)
MCH: 25.3 pg — ABNORMAL LOW (ref 26.6–33.0)
MCHC: 31.5 g/dL (ref 31.5–35.7)
MCV: 80 fL (ref 79–97)
Platelets: 406 10*3/uL (ref 150–450)
RBC: 4.87 x10E6/uL (ref 3.77–5.28)
RDW: 14.5 % (ref 11.7–15.4)
WBC: 8.6 10*3/uL (ref 3.4–10.8)

## 2024-02-02 LAB — HEP, RPR, HIV PANEL
HIV Screen 4th Generation wRfx: NONREACTIVE
Hepatitis B Surface Ag: NEGATIVE
RPR Ser Ql: NONREACTIVE

## 2024-02-02 LAB — TESTOSTERONE, FREE, TOTAL, SHBG
Sex Hormone Binding: 14.9 nmol/L — ABNORMAL LOW (ref 24.6–122.0)
Testosterone, Free: 1.8 pg/mL (ref 0.0–4.2)
Testosterone: 34 ng/dL (ref 13–71)

## 2024-02-02 LAB — TSH+FREE T4
Free T4: 1.34 ng/dL (ref 0.82–1.77)
TSH: 0.688 u[IU]/mL (ref 0.450–4.500)

## 2024-02-02 LAB — HEMOGLOBIN A1C
Est. average glucose Bld gHb Est-mCnc: 143 mg/dL
Hgb A1c MFr Bld: 6.6 % — ABNORMAL HIGH (ref 4.8–5.6)

## 2024-02-02 NOTE — Telephone Encounter (Signed)
 Verified patient name and DOB. Called patient regarding her questions about recent lab work. She already has appointment with [facility]. Desires referral to endocrinology for elevated Hgb A1c. Referral placed.

## 2024-03-17 ENCOUNTER — Other Ambulatory Visit

## 2024-03-24 ENCOUNTER — Ambulatory Visit (INDEPENDENT_AMBULATORY_CARE_PROVIDER_SITE_OTHER): Admitting: "Endocrinology

## 2024-03-24 ENCOUNTER — Encounter: Payer: Self-pay | Admitting: "Endocrinology

## 2024-03-24 VITALS — BP 110/80 | HR 75 | Ht 62.0 in | Wt 200.0 lb

## 2024-03-24 DIAGNOSIS — Z7985 Long-term (current) use of injectable non-insulin antidiabetic drugs: Secondary | ICD-10-CM | POA: Diagnosis not present

## 2024-03-24 DIAGNOSIS — E7800 Pure hypercholesterolemia, unspecified: Secondary | ICD-10-CM

## 2024-03-24 DIAGNOSIS — E66812 Obesity, class 2: Secondary | ICD-10-CM | POA: Diagnosis not present

## 2024-03-24 DIAGNOSIS — Z6836 Body mass index (BMI) 36.0-36.9, adult: Secondary | ICD-10-CM

## 2024-03-24 DIAGNOSIS — E119 Type 2 diabetes mellitus without complications: Secondary | ICD-10-CM | POA: Diagnosis not present

## 2024-03-24 MED ORDER — TIRZEPATIDE 2.5 MG/0.5ML ~~LOC~~ SOAJ
2.5000 mg | SUBCUTANEOUS | 0 refills | Status: DC
Start: 2024-03-24 — End: 2024-04-01

## 2024-03-24 NOTE — Patient Instructions (Signed)

## 2024-03-24 NOTE — Progress Notes (Signed)
 Outpatient Endocrinology Note Janice Newcomer, MD    Janice Haynes [birth year] [id number]  Referring Provider: Tammie Haynes, CNM Primary Care Provider: [facility] Reason for consultation: Subjective   Assessment & Plan  Diagnoses and all orders for this visit:  Controlled type 2 diabetes mellitus without complication, without long-term current use of insulin (HCC) -     tirzepatide (MOUNJARO) 2.5 MG/0.5ML Pen; Inject 2.5 mg into the skin once a week. -     Lipid panel -     Microalbumin / creatinine urine ratio  Pure hypercholesterolemia  Class 2 severe obesity due to excess calories with serious comorbidity and body mass index (BMI) of 36.0 to 36.9 in adult [facility])  Type 2 diabetes confirmed by A1c and elevated blood sugar No neuropathy/retinopathy/nephropathy/MI/stroke  Patient currently on metformin 500 mg twice a day without side effects Recommend Mounjaro 2.5 mg/week No history of MEN syndrome/medullary thyroid  cancer/pancreatitis or pancreatic cancer in self or family  Patient has history of PCOS confirmed by irregular bleeding, acne, and ultrasound pelvis showing bilateral polycystic ovaries Patient currently has diabetes, hypercholesterolemia and grade 2 obesity Recommend referral to infertility clinic given that patient has been trying to get pregnant for the first 2 years without results  Return in about 4 weeks (around 04/21/2024) for visit and 8 am labs before next visit.   I have reviewed current medications, nurse's notes, allergies, vital signs, past medical and surgical history, family medical history, and social history for this encounter. Counseled patient on symptoms, examination findings, lab findings, imaging results, treatment decisions and monitoring and prognosis. The patient understood the recommendations and agrees with the treatment plan. All questions regarding treatment plan were fully answered.  Janice Newcomer, MD  03/24/24   History  of Present Illness HPI  Janice Haynes is a 23 y.o. year old female who presents for evaluation of PCOS and diabetes.  Sometimes bleeds for 1-2 months, thinks cycles were regular when she was younger around 16 years, before she gained weight. 2020 is when she think she started gaining weight, before that she weighed around 150 lbs.  Also reports acne on face 10/2021 TRANSABDOMINAL AND TRANSVAGINAL ULTRASOUND OF PELVIS reported Polycystic ovarian morphology. Correlation with clinical exam and biochemical markers recommended to evaluate for possibility of polycystic ovarian syndrome.  Patient has been trying for becoming pregnant for 2 years She is on metformin 500 mg bid and has no side effects  No neuropathy/retinopathy/nephropathy/MI/stroke   Physical Exam  BP 110/80   Pulse 75   Ht 5\' 2"  (1.575 m)   Wt 200 lb (90.7 kg)   SpO2 98%   BMI 36.58 kg/m    Constitutional: well developed, well nourished Head: normocephalic, atraumatic Eyes: sclera anicteric, no redness Neck: supple Lungs: normal respiratory effort Neurology: alert and oriented Skin: dry, no appreciable rashes Musculoskeletal: no appreciable defects Psychiatric: normal mood and affect   Current Medications Patient's Medications  New Prescriptions   TIRZEPATIDE (MOUNJARO) 2.5 MG/0.5ML PEN    Inject 2.5 mg into the skin once a week.  Previous Medications   No medications on file  Modified Medications   No medications on file  Discontinued Medications   No medications on file    Allergies Allergies  Allergen Reactions   Gramineae Pollens Itching    Past Medical History Past Medical History:  Diagnosis Date   No pertinent past medical history     Past Surgical History Past Surgical History:  Procedure Laterality Date  no surgical history      Family History family history includes Asthma in her father and mother; Hypertension in her mother.  Social History Social History   Socioeconomic History    Marital status: Single    Spouse name: Not on file   Number of children: Not on file   Years of education: Not on file   Highest education level: Not on file  Occupational History   Not on file  Tobacco Use   Smoking status: Never   Smokeless tobacco: Never  Vaping Use   Vaping status: Never Used  Substance and Sexual Activity   Alcohol use: Never   Drug use: Never   Sexual activity: Yes    Birth control/protection: None  Other Topics Concern   Not on file  Social History Narrative   Not on file   Social Drivers of Health   Financial Resource Strain: Patient Declined (02/05/2024)   Received from [facility]   Overall Financial Resource Strain (CARDIA)    Difficulty of Paying Living Expenses: Patient declined  Food Insecurity: Patient Declined (02/05/2024)   Received from [facility]   Hunger Vital Sign    Worried About Running Out of Food in the Last Year: Patient declined    Ran Out of Food in the Last Year: Patient declined  Transportation Needs: Patient Declined (02/05/2024)   Received from [facility] - Transportation    In the past 12 months, has lack of transportation kept you from medical appointments or from getting medications?: Patient declined    Lack of Transportation (Non-Medical): Patient declined  Physical Activity: Inactive (07/01/2022)   Received from [facility], [facility]   Exercise Vital Sign    Days of Exercise per Week: 0 days    Minutes of Exercise per Session: 0 min  Stress: Stress Concern Present (07/01/2022)   Received from [facility], [facility]   [employer] of Occupational [facility] - Occupational Stress Questionnaire    Feeling of Stress : To some extent  Social Connections: Moderately Isolated (07/01/2022)   Received from [facility], [facility]   Social  Connection and Isolation Panel [NHANES]    Frequency of Communication with Friends and Family: More than three times a week    Frequency of Social Gatherings with Friends and Family: More than three times a week    Attends Religious Services: Never    Database administrator or Organizations: No    Attends Engineer, structural: Never    Marital Status: Living with partner  Intimate Partner Violence: Not on file    No results found for: "CHOL" No results found for: "HDL" No results found for: "LDLCALC" No results found for: "TRIG" No results found for: "CHOLHDL" Lab Results  Component Value Date   CREATININE 0.73 02/25/2023   No results found for: "GFR"    Component Value Date/Time   NA 135 02/25/2023 2258   K 3.6 02/25/2023 2258   CL 102 02/25/2023 2258   CO2 24 02/25/2023 2258   GLUCOSE 185 (H) 02/25/2023 2258   BUN 11 02/25/2023 2258   CREATININE 0.73 02/25/2023 2258   CALCIUM 8.8 (L) 02/25/2023 2258   PROT 7.6 02/25/2023 2258   ALBUMIN 3.9 02/25/2023 2258   AST 23 02/25/2023 2258   ALT 26 02/25/2023 2258   ALKPHOS 60 02/25/2023 2258   BILITOT 0.4 02/25/2023  2258   GFRNONAA >60 02/25/2023 2258      Latest Ref Rng & Units 02/25/2023   10:58 PM 11/05/2021   10:54 PM  BMP  Glucose 70 - 99 mg/dL 540  981   BUN 6 - 20 mg/dL 11  13   Creatinine 1.91 - 1.00 mg/dL 4.78  2.95   Sodium 621 - 145 mmol/L 135  136   Potassium 3.5 - 5.1 mmol/L 3.6  3.9   Chloride 98 - 111 mmol/L 102  105   CO2 22 - 32 mmol/L 24  24   Calcium 8.9 - 10.3 mg/dL 8.8  8.6        Component Value Date/Time   WBC 8.6 01/27/2024 1443   WBC 10.7 (H) 02/25/2023 2258   RBC 4.87 01/27/2024 1443   RBC 4.42 02/25/2023 2258   HGB 12.3 01/27/2024 1443   HCT 39.0 01/27/2024 1443   PLT 406 01/27/2024 1443   MCV 80 01/27/2024 1443   MCH 25.3 (L) 01/27/2024 1443   MCH 25.3 (L) 02/25/2023 2258   MCHC 31.5 01/27/2024 1443   MCHC 30.6 02/25/2023 2258   RDW 14.5 01/27/2024 1443   LYMPHSABS 5.1 (H)  02/25/2023 2258   MONOABS 0.4 02/25/2023 2258   EOSABS 0.2 02/25/2023 2258   BASOSABS 0.1 02/25/2023 2258   Lab Results  Component Value Date   TSH 0.688 01/27/2024   FREET4 1.34 01/27/2024         Parts of this note may have been dictated using voice recognition software. There may be variances in spelling and vocabulary which are unintentional. Not all errors are proofread. Please notify the [facility] if any discrepancies are noted or if the meaning of any statement is not clear.

## 2024-03-25 ENCOUNTER — Other Ambulatory Visit: Payer: Self-pay | Admitting: "Endocrinology

## 2024-03-25 DIAGNOSIS — E119 Type 2 diabetes mellitus without complications: Secondary | ICD-10-CM

## 2024-03-25 MED ORDER — TIRZEPATIDE-WEIGHT MANAGEMENT 2.5 MG/0.5ML ~~LOC~~ SOLN: 2.5000 mg | SUBCUTANEOUS | 1 refills | Status: DC

## 2024-03-26 ENCOUNTER — Other Ambulatory Visit: Payer: Self-pay

## 2024-03-26 ENCOUNTER — Other Ambulatory Visit (HOSPITAL_COMMUNITY): Payer: Self-pay

## 2024-03-29 ENCOUNTER — Telehealth: Payer: Self-pay

## 2024-03-29 ENCOUNTER — Other Ambulatory Visit (HOSPITAL_COMMUNITY): Payer: Self-pay

## 2024-03-29 NOTE — Telephone Encounter (Signed)
 Pharmacy Patient Advocate Encounter  Received notification from [employer] that Prior Authorization for Zepbound has been DENIED.  See denial reason below. No denial letter attached in CMM. Will attach denial letter to Media tab once received.   PA #/Case ID/Reference #: [id number]

## 2024-03-29 NOTE — Telephone Encounter (Signed)
 Spoke to pt regarding PA. Informed pt that I has sent the PA team a message informing them that pt needs a PA done.

## 2024-03-29 NOTE — Telephone Encounter (Signed)
 Pharmacy Patient Advocate Encounter   Received notification from Patient Advice Request messages that prior authorization for Zepbound is required/requested.   Insurance verification completed.   The patient is insured through [facility] .   Per test claim: PA required; PA submitted to above mentioned insurance via CoverMyMeds Key/confirmation #/EOC [facility] Status is pending

## 2024-04-01 ENCOUNTER — Other Ambulatory Visit: Payer: Self-pay | Admitting: "Endocrinology

## 2024-04-01 DIAGNOSIS — E119 Type 2 diabetes mellitus without complications: Secondary | ICD-10-CM

## 2024-04-01 DIAGNOSIS — E66812 Obesity, class 2: Secondary | ICD-10-CM

## 2024-04-01 DIAGNOSIS — E7800 Pure hypercholesterolemia, unspecified: Secondary | ICD-10-CM

## 2024-04-01 MED ORDER — WEGOVY 0.25 MG/0.5ML ~~LOC~~ SOAJ: 0.2500 mg | SUBCUTANEOUS | 3 refills | Status: AC

## 2024-04-02 NOTE — Telephone Encounter (Signed)
 Called pharmacy was unable to speak with anyone.

## 2024-04-06 NOTE — Telephone Encounter (Signed)
 Pt need PA done for wegovy . Thanks!

## 2024-04-07 ENCOUNTER — Other Ambulatory Visit (HOSPITAL_COMMUNITY): Payer: Self-pay

## 2024-04-07 ENCOUNTER — Telehealth: Payer: Self-pay

## 2024-04-07 NOTE — Telephone Encounter (Signed)
 Pharmacy Patient Advocate Encounter   Received notification from Pt Calls Messages that prior authorization for Wegovy  is required/requested.   Insurance verification completed.   The patient is insured through [facility] .   Per test claim: PA required; PA submitted to above mentioned insurance via CoverMyMeds Key/confirmation #/EOC [id number] Status is pending

## 2024-04-21 ENCOUNTER — Other Ambulatory Visit

## 2024-04-28 ENCOUNTER — Ambulatory Visit: Admitting: "Endocrinology

## 2024-05-05 DIAGNOSIS — E119 Type 2 diabetes mellitus without complications: Secondary | ICD-10-CM | POA: Diagnosis not present

## 2024-05-10 ENCOUNTER — Other Ambulatory Visit: Payer: Self-pay | Admitting: Nurse Practitioner

## 2024-05-10 DIAGNOSIS — N6452 Nipple discharge: Secondary | ICD-10-CM

## 2024-05-10 DIAGNOSIS — N644 Mastodynia: Secondary | ICD-10-CM

## 2024-05-17 NOTE — Telephone Encounter (Signed)
 CMM states an error occurred and they did not receive the PA.   New request has been submitted  Key: [id number]

## 2024-05-19 ENCOUNTER — Other Ambulatory Visit

## 2024-05-25 ENCOUNTER — Ambulatory Visit
Admission: RE | Admit: 2024-05-25 | Discharge: 2024-05-25 | Disposition: A | Discharge status: Home / Self Care | Source: Ambulatory Visit | Attending: Nurse Practitioner | Admitting: Nurse Practitioner

## 2024-05-25 DIAGNOSIS — N6452 Nipple discharge: Secondary | ICD-10-CM | POA: Diagnosis present

## 2024-05-25 DIAGNOSIS — N644 Mastodynia: Secondary | ICD-10-CM | POA: Insufficient documentation

## 2024-06-02 ENCOUNTER — Other Ambulatory Visit (HOSPITAL_COMMUNITY): Payer: Self-pay

## 2024-06-03 NOTE — Telephone Encounter (Signed)
 Pharmacy Patient Advocate Encounter  Received notification from [employer] that Prior Authorization for Wegovy  has been DENIED.  See denial reason below. No denial letter attached in CMM. Will attach denial letter to Media tab once received.   PA #/Case ID/Reference #: [id number]   I am assuming they are basing it off a note in her chart that says She did consult with nutritionist as recommended 3 months ago once but did not find it helpful and did not return.

## 2024-09-24 ENCOUNTER — Encounter: Payer: Self-pay | Admitting: Obstetrics

## 2024-09-24 NOTE — Progress Notes (Deleted)
    GYNECOLOGY PROGRESS NOTE  Subjective:  PCP: Cletus Glenn  Patient ID: Janice Haynes, female    DOB: Dec 02, 2000, 23 y.o.   MRN: [id number]  HPI  Patient is a 23 y.o. G0P0000 female who presents for follow up of irregular menstrual cycles.  She was seen by Janice Haynes CNM 01/27/24 because she had been trying to conceive for a year and was having irregular cycles.  Last TVUS was in 2022 showing polycystic ovarian morphology.   PCOS labs were drawn at this visit, but patient declined hormonal regulation as pregnancy is desired.  She was encouraged to lose weight and was referred to [facility].  Since her last visit she has been diagnosed with Type 2 Diabetes and is taking Metformin 500mg  bid.      {Common ambulatory SmartLinks:19316}  Review of Systems {ros; complete:30496}   Objective:   There were no vitals taken for this visit. There is no height or weight on file to calculate BMI.  General appearance: {general exam:16600} Abdomen: {abdominal exam:16834} Pelvic: {pelvic exam:16852::cervix normal in appearance,external genitalia normal,no adnexal masses or tenderness,no cervical motion tenderness,rectovaginal septum normal,uterus normal size, shape, and consistency,vagina normal without discharge} Extremities: {extremity exam:5109} Neurologic: {neuro exam:17854}   Assessment/Plan:   No diagnosis found.   There are no diagnoses linked to this encounter.     Estil Mangle, DO [facility]

## 2024-09-29 ENCOUNTER — Ambulatory Visit: Admitting: Obstetrics

## 2024-09-29 DIAGNOSIS — E282 Polycystic ovarian syndrome: Secondary | ICD-10-CM
